# Patient Record
Sex: Female | Born: 1935 | Race: White | Hispanic: No | Smoking: Never smoker
Health system: Southern US, Community
[De-identification: ages and names within clinical notes are randomized; demographics above are authoritative.]

## PROBLEM LIST (undated history)

## (undated) DIAGNOSIS — I10 Essential (primary) hypertension: Secondary | ICD-10-CM

## (undated) DIAGNOSIS — G709 Myoneural disorder, unspecified: Secondary | ICD-10-CM

## (undated) DIAGNOSIS — M1990 Unspecified osteoarthritis, unspecified site: Secondary | ICD-10-CM

## (undated) HISTORY — PX: JOINT REPLACEMENT: SHX530

## (undated) HISTORY — PX: ANTERIOR AND POSTERIOR REPAIR: SHX1172

## (undated) HISTORY — PX: GANGLION CYST EXCISION: SHX1691

## (undated) HISTORY — PX: ABDOMINAL HYSTERECTOMY: SHX81

## (undated) HISTORY — PX: HAMMER TOE SURGERY: SHX385

## (undated) HISTORY — PX: GASTRIC BYPASS: SHX52

---

## 2005-05-20 ENCOUNTER — Ambulatory Visit: Payer: Self-pay | Admitting: *Deleted

## 2006-06-01 ENCOUNTER — Ambulatory Visit: Payer: Self-pay | Admitting: *Deleted

## 2007-05-25 ENCOUNTER — Ambulatory Visit: Payer: Self-pay | Admitting: Gastroenterology

## 2007-06-23 ENCOUNTER — Ambulatory Visit: Payer: Self-pay | Admitting: *Deleted

## 2007-08-24 ENCOUNTER — Ambulatory Visit: Payer: Self-pay | Admitting: Unknown Physician Specialty

## 2008-02-28 ENCOUNTER — Ambulatory Visit: Payer: Self-pay | Admitting: Unknown Physician Specialty

## 2008-07-31 ENCOUNTER — Ambulatory Visit: Payer: Self-pay | Admitting: Family Medicine

## 2008-09-11 ENCOUNTER — Ambulatory Visit: Payer: Self-pay | Admitting: Diagnostic Radiology

## 2009-08-22 ENCOUNTER — Ambulatory Visit: Payer: Self-pay | Admitting: Family Medicine

## 2009-10-28 ENCOUNTER — Ambulatory Visit: Payer: Self-pay | Admitting: Unknown Physician Specialty

## 2009-11-06 ENCOUNTER — Inpatient Hospital Stay: Payer: Self-pay | Admitting: Unknown Physician Specialty

## 2009-11-12 ENCOUNTER — Encounter: Payer: Self-pay | Admitting: Internal Medicine

## 2010-09-24 ENCOUNTER — Ambulatory Visit: Payer: Self-pay | Admitting: Family Medicine

## 2010-10-27 ENCOUNTER — Ambulatory Visit: Payer: Self-pay | Admitting: Family Medicine

## 2010-10-29 ENCOUNTER — Ambulatory Visit: Payer: Self-pay | Admitting: Family Medicine

## 2010-11-24 ENCOUNTER — Ambulatory Visit: Payer: Self-pay | Admitting: Surgery

## 2010-11-26 LAB — PATHOLOGY REPORT

## 2011-11-04 ENCOUNTER — Ambulatory Visit: Payer: Self-pay | Admitting: Family Medicine

## 2012-01-06 ENCOUNTER — Ambulatory Visit: Payer: Self-pay | Admitting: Ophthalmology

## 2012-01-06 LAB — POTASSIUM: Potassium: 4.1 mmol/L (ref 3.5–5.1)

## 2012-01-18 ENCOUNTER — Ambulatory Visit: Payer: Self-pay | Admitting: Ophthalmology

## 2012-02-08 ENCOUNTER — Ambulatory Visit: Payer: Self-pay | Admitting: Ophthalmology

## 2012-12-07 ENCOUNTER — Ambulatory Visit: Payer: Self-pay | Admitting: Family Medicine

## 2013-12-11 ENCOUNTER — Ambulatory Visit: Payer: Self-pay | Admitting: Family Medicine

## 2015-02-10 NOTE — Op Note (Signed)
PATIENT NAME:  Melinda Moses, Melinda Moses MR#:  [id number] DATE OF BIRTH:  08/17/1933  DATE OF PROCEDURE:  02/08/2012  PREOPERATIVE DIAGNOSIS: Cataract, left eye.   POSTOPERATIVE DIAGNOSIS: Cataract, left eye.   PROCEDURE PERFORMED: Extracapsular cataract extraction using phacoemulsification with placement of an Alcon toric [id number] 24.0-diopter posterior chamber lens with 1.5 diopters a cylinder, serial number [id number].   SURGEON: Maylon PeppersSteven A. Marg Macmaster, MD   ANESTHESIA: 4% lidocaine and 0.75% Marcaine in a 50-50 mixture with 10 units/mL of Hylenex added, given as a peribulbar.   ANESTHESIOLOGIST: Cena BentonG. F. Van Staveren, MD   COMPLICATIONS: None.   ESTIMATED BLOOD LOSS: Less than 1 mL.   DESCRIPTION OF PROCEDURE: The patient was brought to the Operating Room, and both eyes were anesthetized with topical proparacaine. With the patient sitting upright and fixing on a distant target, the 3:00 and 9:00 positions were marked using an [place] toric marker. The patient was then placed supine, given IV sedation and peribulbar block. She was then prepped and draped in the usual fashion. The vertical rectus muscles were imbricated using 5-0 silk sutures, a bridle suture, and limbal peritomy was carried out at 90 degrees. The toric marker was brought to the table and placed over the 3:00 and 9:00 marks, and the 46-degree position was then marked using a marking pen. A partial thickness scleral groove was made at the 90-degree mark and dissected anteriorly into clear cornea with an Alcon crescent knife. The anterior chamber was entered superotemporally through clear cornea with a paracentesis knife and through the lamellar dissection with a 2.6 mm keratome. DisCoVisc was used to replace the aqueous, and a continuous tear circular capsulorrhexis was carried out. Hydrodelineation was used to loosen the nucleus. Phacoemulsification was carried out in a divide and conquer technique. Ultrasound time was one minute and 8 seconds  with an average power of 16%, CDE of 19.40. Irrigation-aspiration was used to remove the residual cortex. The capsular bag was inflated with DisCoVisc, and the intraocular lens was inserted into the capsular bag using a Par[employer]arch shooter. The lens was rotated so the marks in the haptics lined up with the 46-degree marks. Irrigation-aspiration was used to remove the residual DisCoVisc. Care was taken to make sure that the lens did not rotate during removal of the DisCoVisc. Position of the lens was reconfirmed, and the wound was inflated with balanced salt. Miostat was injected through the paracentesis tract to induce miosis and deepen the chamber. The wound was checked for leaks and none were found. The conjunctiva was closed with cautery. The bridle suture was removed. Two drops of Vigamox were placed on the eye. The patient was discharged to the recovery room in good condition.   ____________________________ Maylon PeppersSteven A. Tay Whitwell, MD sad:cbb D: 02/08/2012 13:02:12 ET T: 02/08/2012 13:22:00 ET JOB#: [id number]  cc: Viviann SpareSteven A. Shi Grose, MD, <Dictator> Erline LevineSTEVEN A Ambrea Hegler MD ELECTRONICALLY SIGNED 02/15/2012 13:45

## 2015-02-10 NOTE — Op Note (Signed)
PATIENT NAME:  Melinda Moses, Melinda Moses MR#:  [id number] DATE OF BIRTH:  01-16-1933  DATE OF PROCEDURE:  01/18/2012  PREOPERATIVE DIAGNOSIS: Cataract, right eye.   POSTOPERATIVE DIAGNOSIS: Cataract, right eye.   PROCEDURE PERFORMED: Extracapsular cataract extraction using phacoemulsification with placement of an Alcon [id number] 25.5-diopter posterior chamber lens with 1.5 diopters of cylinder, serial number [id number].   SURGEON: Maylon PeppersSteven A. Shaunta Oncale, MD  ANESTHESIA: 4% lidocaine, 0.75% Marcaine 50-50 mixture with 10 units/mL of Hylenex added, given as a peribulbar.   ANESTHESIOLOGIST: Dr. Zena AmosWilliam Kephart  COMPLICATIONS: None.   ESTIMATED BLOOD LOSS: Less than 1 mL.   DESCRIPTION OF PROCEDURE: Patient was brought to the Operating Room and each eye was anesthetized with topical proparacaine. Sitting upright a degree marker was used to mark the 3:00 and 9:00 positions on the cornea. The patient was placed supine and given IV sedation and a peribulbar block. She was then prepped and draped in the usual fashion. The vertical rectus muscles were imbricated using 5-0 silk sutures, bridle sutures. A degree marker was brought to the table and centered over the 3:00 and 9:00 positions. 110 degrees axis was marked with a marking pen. Conjunctiva was taken down for one clock hour centered around the 110 degree mark and a partial thickness scleral groove was made at the posterior surgical limbus. This was dissected anteriorly into clear cornea with an Alcon crescent knife. The anterior chamber was entered superonasally through clear cornea with a paracentesis knife and through the lamellar dissection with a 2.6 mm keratome. DisCoVisc was used to replace the aqueous. A continuous tear circular capsulorrhexis was carried out. Hydrodissection was used to loosen the nucleus and phacoemulsification was carried out in a divide and conquer technique. Ultrasound time was 1 minute, 23 seconds, average power 18.3% with CDE 26.16.  Irrigation/aspiration was used to remove the residual cortex. Capsular bag was inflated with DisCoVisc and the intraocular lens was inserted into the capsular bag. Care was taken to rotate the lens to line up with the 110 degree marks using Sinskey hook. DisCoVisc was carefully removed using I/A hand piece. The position of lens was confirmed. The wound was inflated with balanced salt and Miostat was injected through the paracentesis track. The wound was checked for leaks, none were found. The conjunctiva was then closed with cautery. The bridle sutures were removed and two drops of Vigamox were placed on the eye. A shield was placed on the eye and patient was discharged to recovery room in good condition.   ____________________________ Maylon PeppersSteven A. Nayan Proch, MD sad:cms D: 01/18/2012 13:16:41 ET T: 01/18/2012 13:59:32 ET JOB#: [id number]  cc: Viviann SpareSteven A. Brittney Caraway, MD, <Dictator> Erline LevineSTEVEN A Tonga Prout MD ELECTRONICALLY SIGNED 01/19/2012 13:57

## 2015-03-14 ENCOUNTER — Other Ambulatory Visit: Payer: Self-pay | Admitting: Family Medicine

## 2015-03-14 DIAGNOSIS — Z0000 Encounter for general adult medical examination without abnormal findings: Secondary | ICD-10-CM

## 2015-03-21 ENCOUNTER — Ambulatory Visit
Admission: RE | Admit: 2015-03-21 | Discharge: 2015-03-21 | Disposition: A | Discharge status: Home / Self Care | Payer: Medicare Other | Source: Ambulatory Visit | Attending: Family Medicine | Admitting: Family Medicine

## 2015-03-21 DIAGNOSIS — Z0000 Encounter for general adult medical examination without abnormal findings: Secondary | ICD-10-CM | POA: Diagnosis not present

## 2015-03-21 DIAGNOSIS — M8580 Other specified disorders of bone density and structure, unspecified site: Secondary | ICD-10-CM | POA: Insufficient documentation

## 2016-10-23 ENCOUNTER — Other Ambulatory Visit: Payer: Self-pay | Admitting: Student

## 2016-10-23 DIAGNOSIS — M5441 Lumbago with sciatica, right side: Principal | ICD-10-CM

## 2016-10-23 DIAGNOSIS — G8929 Other chronic pain: Secondary | ICD-10-CM

## 2016-10-29 ENCOUNTER — Ambulatory Visit
Admission: RE | Admit: 2016-10-29 | Discharge: 2016-10-29 | Disposition: A | Discharge status: Home / Self Care | Payer: Medicare Other | Source: Ambulatory Visit | Attending: Student | Admitting: Student

## 2016-10-29 DIAGNOSIS — M48061 Spinal stenosis, lumbar region without neurogenic claudication: Secondary | ICD-10-CM | POA: Diagnosis not present

## 2016-10-29 DIAGNOSIS — G8929 Other chronic pain: Secondary | ICD-10-CM | POA: Diagnosis not present

## 2016-10-29 DIAGNOSIS — M419 Scoliosis, unspecified: Secondary | ICD-10-CM | POA: Insufficient documentation

## 2016-10-29 DIAGNOSIS — M5126 Other intervertebral disc displacement, lumbar region: Secondary | ICD-10-CM | POA: Insufficient documentation

## 2016-10-29 DIAGNOSIS — M5441 Lumbago with sciatica, right side: Secondary | ICD-10-CM | POA: Insufficient documentation

## 2017-01-15 ENCOUNTER — Ambulatory Visit
Admission: RE | Admit: 2017-01-15 | Discharge: 2017-01-15 | Disposition: A | Discharge status: Home / Self Care | Payer: Medicare Other | Source: Ambulatory Visit | Attending: Family Medicine | Admitting: Family Medicine

## 2017-01-15 ENCOUNTER — Other Ambulatory Visit: Payer: Self-pay | Admitting: Family Medicine

## 2017-01-15 DIAGNOSIS — M5441 Lumbago with sciatica, right side: Secondary | ICD-10-CM | POA: Insufficient documentation

## 2017-01-15 DIAGNOSIS — G8929 Other chronic pain: Secondary | ICD-10-CM | POA: Insufficient documentation

## 2017-01-15 DIAGNOSIS — R911 Solitary pulmonary nodule: Secondary | ICD-10-CM

## 2017-02-04 ENCOUNTER — Other Ambulatory Visit: Payer: Self-pay | Admitting: Neurological Surgery

## 2017-02-04 DIAGNOSIS — M5416 Radiculopathy, lumbar region: Secondary | ICD-10-CM

## 2017-02-04 DIAGNOSIS — M47816 Spondylosis without myelopathy or radiculopathy, lumbar region: Secondary | ICD-10-CM

## 2017-02-19 ENCOUNTER — Ambulatory Visit
Admission: RE | Admit: 2017-02-19 | Discharge: 2017-02-19 | Disposition: A | Discharge status: Home / Self Care | Payer: Medicare Other | Source: Ambulatory Visit | Attending: Neurological Surgery | Admitting: Neurological Surgery

## 2017-02-19 ENCOUNTER — Encounter: Payer: Self-pay | Admitting: *Deleted

## 2017-02-19 DIAGNOSIS — M5416 Radiculopathy, lumbar region: Secondary | ICD-10-CM

## 2017-02-19 DIAGNOSIS — M48061 Spinal stenosis, lumbar region without neurogenic claudication: Secondary | ICD-10-CM | POA: Insufficient documentation

## 2017-02-19 DIAGNOSIS — M47816 Spondylosis without myelopathy or radiculopathy, lumbar region: Secondary | ICD-10-CM | POA: Diagnosis not present

## 2017-02-19 HISTORY — DX: Essential (primary) hypertension: I10

## 2017-02-19 HISTORY — DX: Myoneural disorder, unspecified: G70.9

## 2017-02-19 HISTORY — DX: Unspecified osteoarthritis, unspecified site: M19.90

## 2017-02-19 MED ORDER — IOPAMIDOL (ISOVUE-M 200) INJECTION 41%
10.0000 mL | Freq: Once | INTRAMUSCULAR | Status: AC
  Administered 2017-02-19: 10 mL via EPIDURAL

## 2017-02-19 MED ORDER — ACETAMINOPHEN 500 MG PO TABS
1000.0000 mg | ORAL_TABLET | Freq: Four times a day (QID) | ORAL | Status: DC | PRN
  Administered 2017-02-19: 1000 mg via ORAL
  Filled 2017-02-19 (×2): qty 2

## 2017-02-19 NOTE — Progress Notes (Signed)
Patient ID: Melinda Moses, female   DOB: 02/24/1933, 83 y.o.   MRN: [id number] Pt. Stable after myelogram.VSS.Back stable.D/C instructions given.F/U with Melinda Moses M.D.

## 2017-02-19 NOTE — Discharge Instructions (Signed)
Myelogram, Care After °Refer to this sheet in the next few weeks. These instructions provide you with information about caring for yourself after your procedure. Your health care provider may also give you more specific instructions. Your treatment has been planned according to current medical practices, but problems sometimes occur. Call your health care provider if you have any problems or questions after your procedure. °What can I expect after the procedure? °After the procedure, it is common to have: °· Soreness at your injection site. °· A mild headache. °Follow these instructions at home: °· Drink enough fluid to keep your urine clear or pale yellow. This will help flush out the dye (contrast material) from your spine. °· Rest as told by your health care provider. Lie flat with your head slightly raised (elevated) to reduce the risk of headache. °· Do not bend, lift, or do any strenuous activity for 24-48 hours or as told by your health care provider. °· Take over-the-counter and prescription medicines only as told by your health care provider. °· Take care of and remove your bandage (dressing) as told by your health care provider. °· Bathe or shower as told by your health care provider. °Contact a health care provider if: °· You have a fever. °· You have a headache that lasts longer than 24 hours. °· You feel nauseous or vomit. °· You have a stiff neck or numbness in your legs. °· You are unable to urinate or have a bowel movement. °· You develop a rash, itching, or sneezing. °Get help right away if: °· You have new symptoms or your symptoms get worse. °· You have a seizure. °· You have trouble breathing. °This information is not intended to replace advice given to you by your health care provider. Make sure you discuss any questions you have with your health care provider. °Document Released: 11/01/2015 Document Revised: 03/12/2016 Document Reviewed: 07/18/2015 °Elsevier Interactive Patient Education © 2017  [employer] ° °

## 2017-12-30 IMAGING — CT CT L SPINE W/O CM
1 of 7 series · 6 of 14 positions shown, 8 images · non-contrast
Comparison: None.

CLINICAL DATA: Chronic low back pain.  Radiating right leg pain.

EXAM:
CT LUMBAR SPINE WITHOUT CONTRAST
TECHNIQUE: Multidetector CT imaging of the lumbar spine was performed without
intravenous contrast administration. Multiplanar CT image
reconstructions were also generated.

[Series 4: l spine soft · axial · 0.34mm/px · z∈[-876,-688]mm · 6 of 132 slices shown, 8 images]
[im 19/132  soft-tissue]
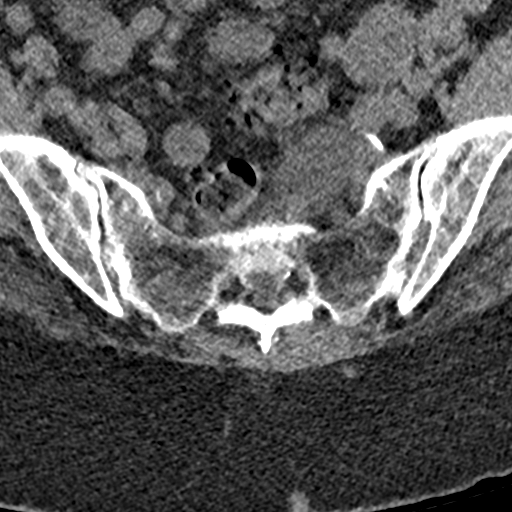
[im 19/132  bone]
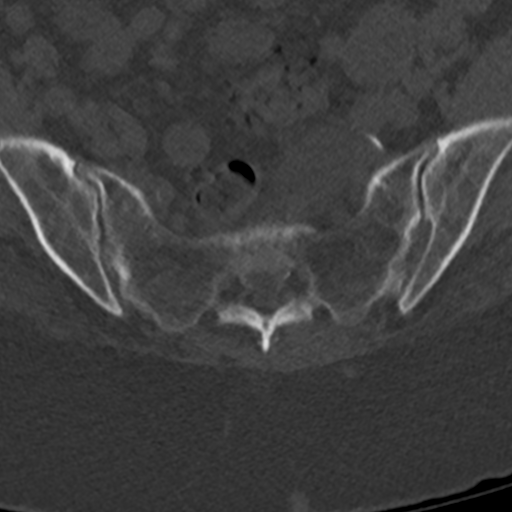
[im 38/132  bone]
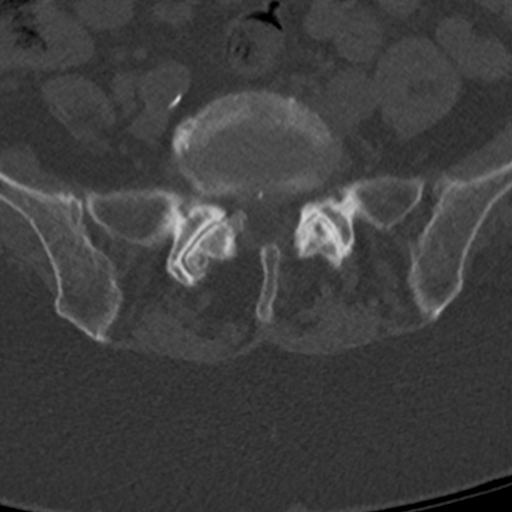
[im 57/132  bone]
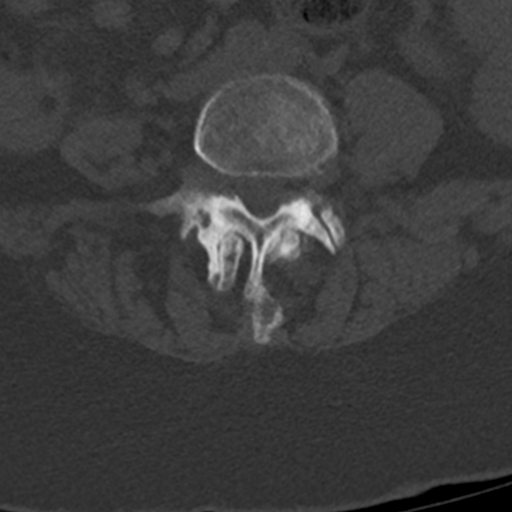
[im 75/132  bone]
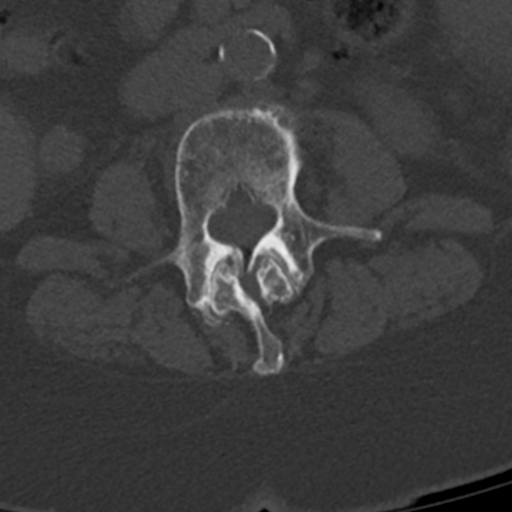
[im 94/132  soft-tissue]
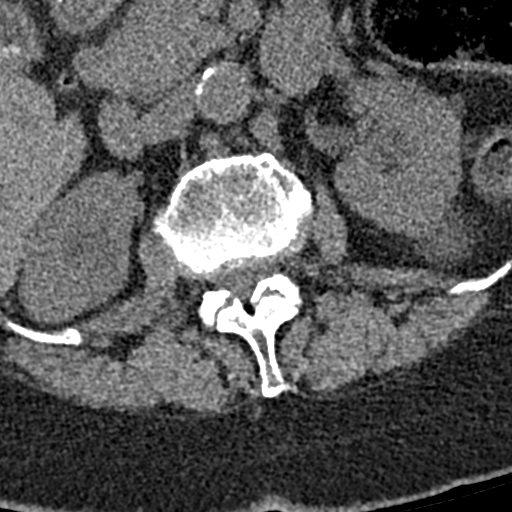
[im 94/132  bone]
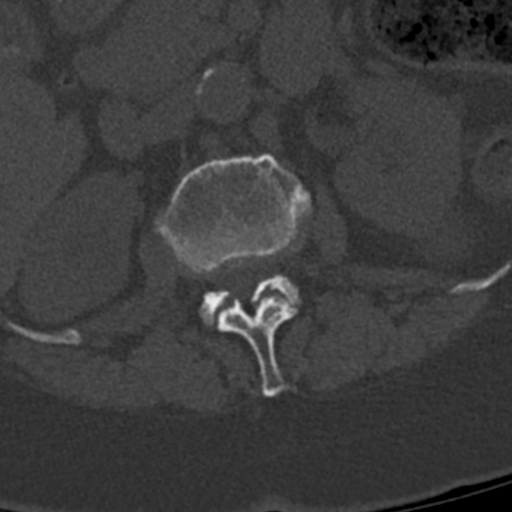
[im 113/132  bone]
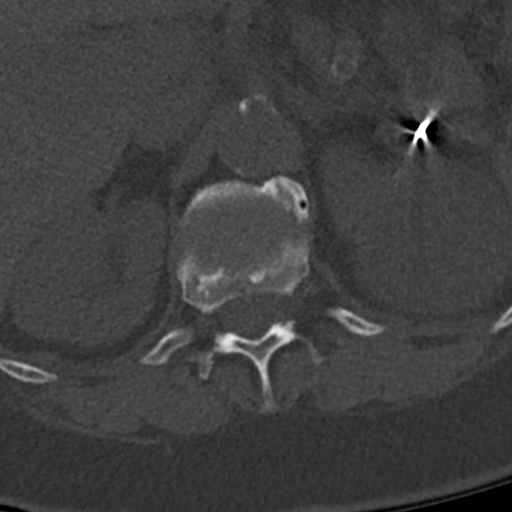

[6 of 14 positions shown; findings below may reference images not displayed]

FINDINGS: Segmentation: 5 lumbar type vertebral bodies. The last full
intervertebral disc space is labeled L5-S1.

Alignment: The overall alignment is maintained in the sagittal
plane. There is moderate scoliosis and degenerative lumbar
spondylosis. Mild degenerative anterolisthesis of L4.

Vertebrae: No fracture or bone lesion.

Paraspinal and other soft tissues: No significant findings.
Gallstones are noted in the gallbladder. There is a large left renal
cyst. Moderate atherosclerotic calcifications involving the aorta.
Surgical changes likely from gastric bypass surgery.

Disc levels: T11-12: Advanced degenerative disc disease and facet
disease but no significant spinal or foraminal stenosis.

T12-L1: Disc disease and facet disease but no spinal or foraminal
stenosis.

L1-2: Diffuse bulging degenerated annulus, osteophytic ridging and
facet disease. Mild bilateral lateral recess stenosis but no
significant spinal or foraminal stenosis.

L2-3: Bulging annulus, osteophytic ridging, short pedicles and facet
disease contributing to mild spinal and moderate bilateral lateral
recess stenosis. No significant foraminal stenosis.

L3-4: Bulging uncovered disc, short pedicles, advanced facet disease
and ligamentum flavum thickening contributing to moderate spinal and
bilateral lateral recess stenosis. Mild foraminal narrowing
bilaterally, right greater than left.

L4-5: Diffuse bulging annulus, osteophytic spurring, short pedicles,
advanced facet disease and thickening and buckling of the ligamentum
flavum contributing to moderately severe to severe spinal and
bilateral lateral recess stenosis. There is also a broad-based right
foraminal disc protrusion likely effecting the right L4 nerve root
and may be responsible for the patient's right radicular symptoms.

L5-S1: Small calcification associate with the posterior aspect of
the disc likely calcifications associate with an annular tear. No
significant disc protrusion, spinal or foraminal stenosis. Severe
facet disease.
IMPRESSION: 1. Scoliosis and advanced degenerative lumbar spondylosis with
multilevel disc disease and facet disease.
2. Moderate multilevel multifactorial spinal and lateral recess
stenosis at L2-3 and L3-4.
3. Moderately severe to severe multifactorial spinal and bilateral
lateral recess stenosis at L4-5. There is also a right foraminal
disc protrusion at this level likely effecting the right L4 nerve
root and possibly responsible for the patient's right radicular
symptoms.

## 2018-04-22 IMAGING — RF DG MYELOGRAM LUMBAR
2 series · 6 of 6 positions shown · non-contrast
Comparison: CT 10/29/2016.

CLINICAL DATA: Lumbar spondylosis.  Radiculopathy.
TECHNIQUE: Contiguous axial images were obtained through the Lumbar spine after
the intrathecal infusion of infusion. Coronal and sagittal
reconstructions were obtained of the axial image sets.

[Series 1: fluoro_myelogram_singleshot_bw · 0.17mm/px · 5 of 5 slices shown]
[im 1/5]
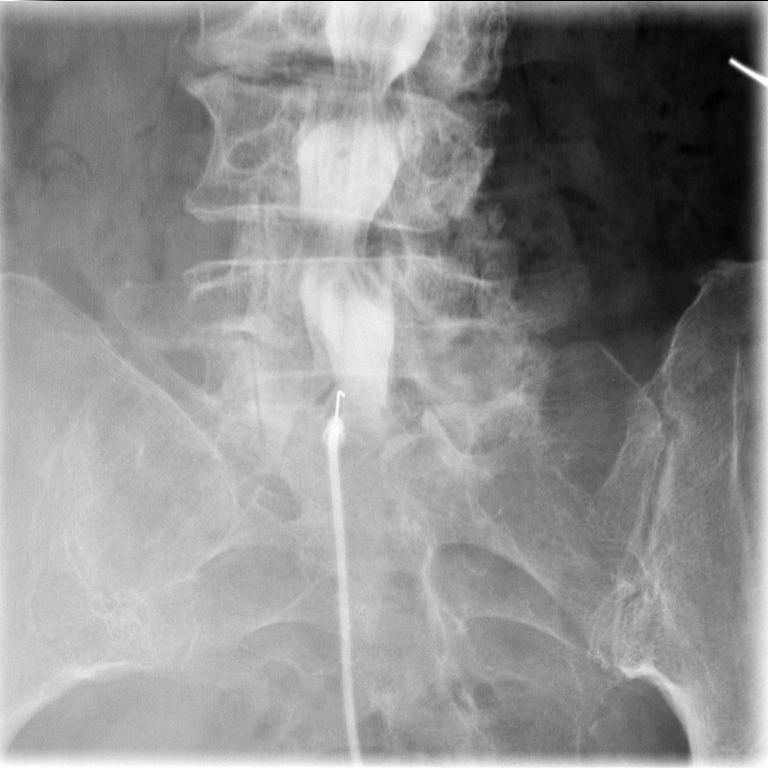
[im 2/5]
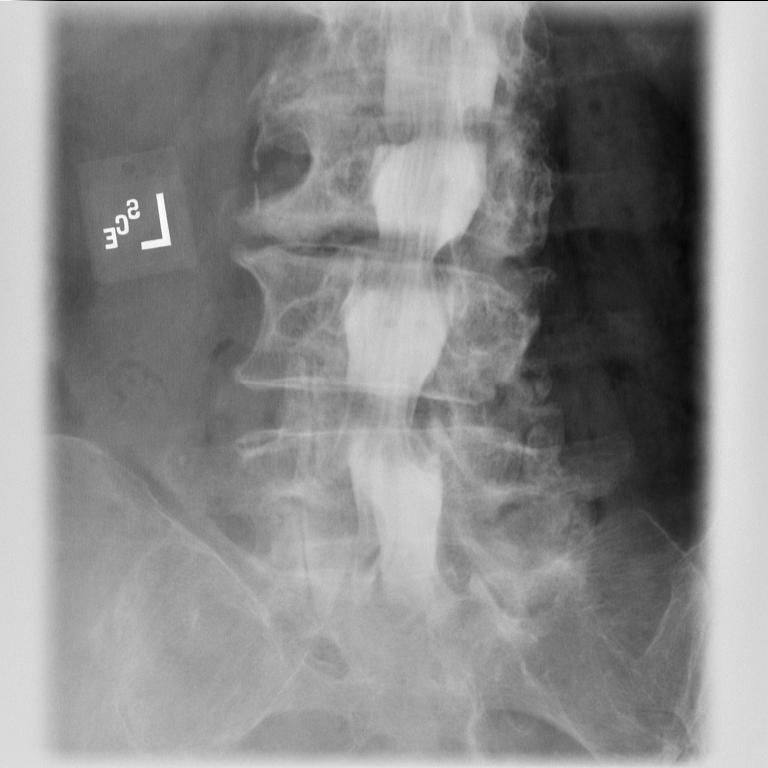
[im 3/5]
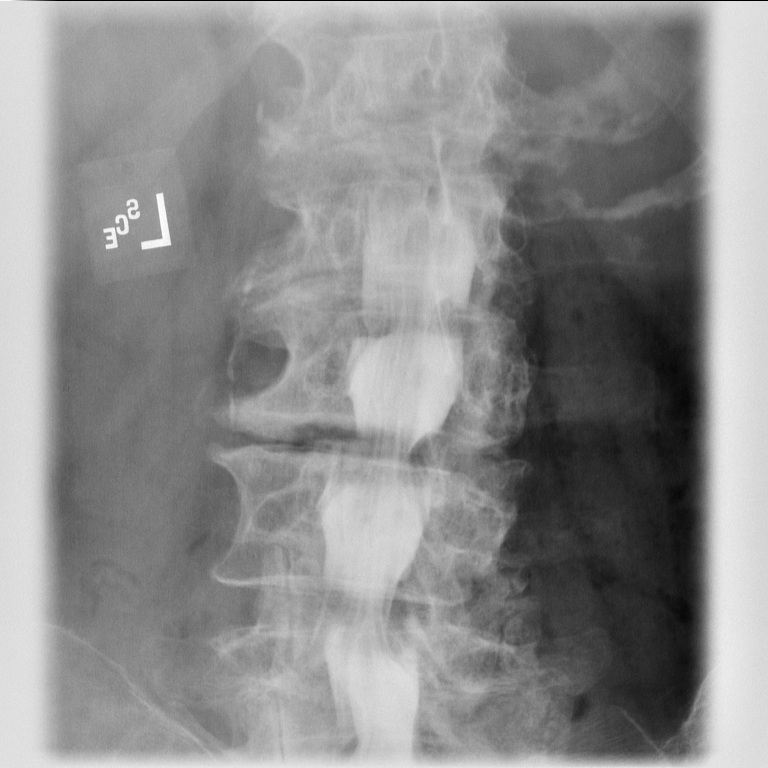
[im 4/5]
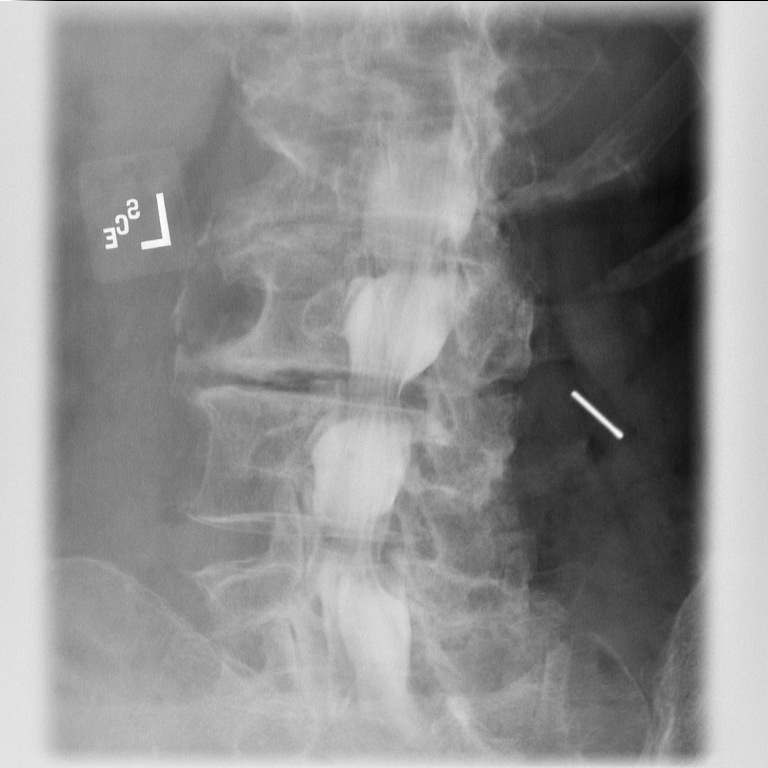
[im 5/5]
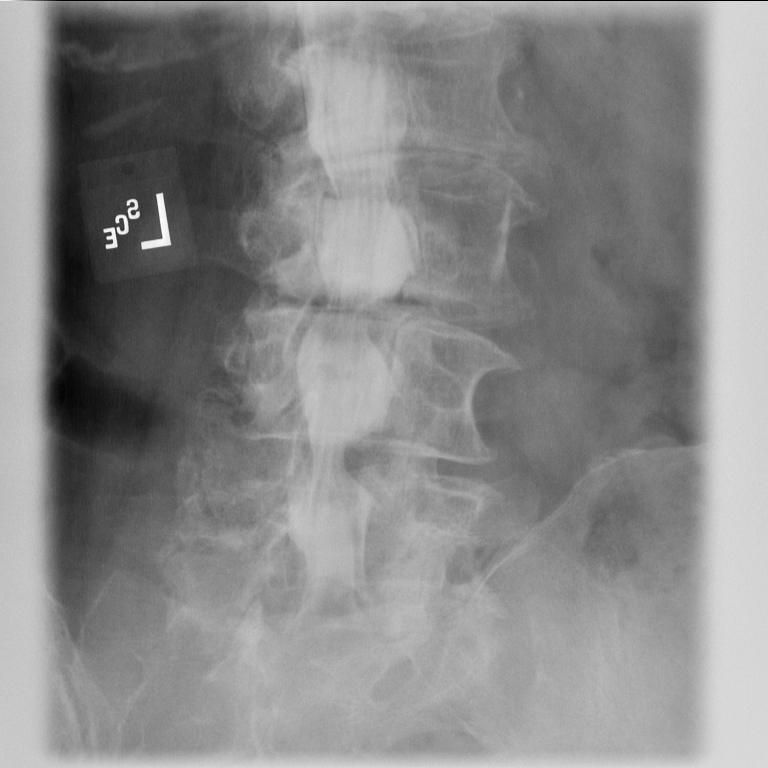

[Series 6: x lumbar spine lat · 0.14mm/px · 1 of 1 slices shown]
[im 1/1]
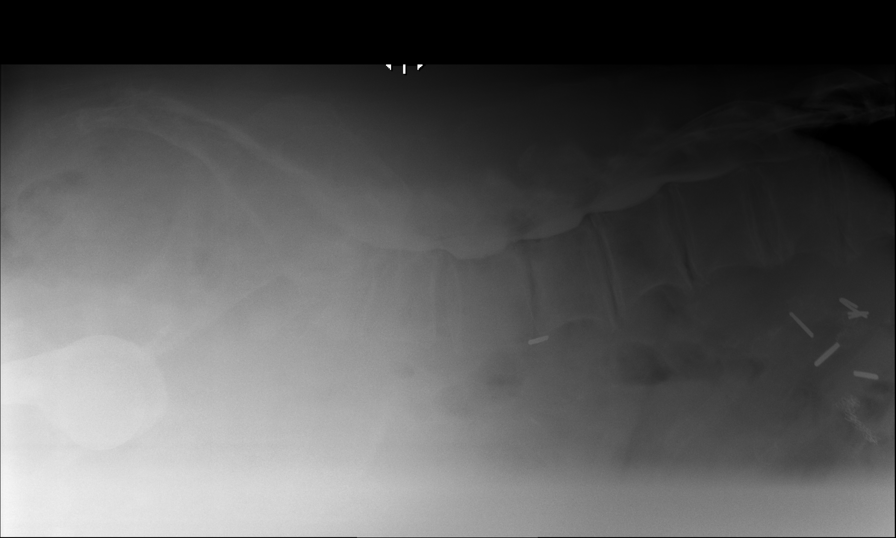

[6 of 6 positions shown; findings below may reference images not displayed]

EXAM:
LUMBAR MYELOGRAM

FLUOROSCOPY TIME:  2 minutes 0 seconds. Radiation dose 120.4 mGy.
Five fluoroscopic spot images obtained.

PROCEDURE:
After thorough discussion of risks and benefits of the procedure
including bleeding, infection, injury to nerves, blood vessels,
adjacent structures as well as headache, written and oral informed
consent was obtained. Consent was obtained by Dr. Japarlin Ratu Murniati.
Time out form was completed.

Patient was positioned prone on the fluoroscopy table. Local
anesthesia was provided with 1% lidocaine without epinephrine after
prepped and draped in the usual sterile fashion. Puncture was
performed at L5-S1 using a 5 inch 22-gauge spinal needle via right
_Paramedian) approach. Using a single pass through the dura, the
needle was placed within the thecal sac, with return of clear CSF.
12 mL of Isovue M 200 was injected into the thecal sac, with normal
opacification of the nerve roots and cauda equina consistent with
free flow within the subarachnoid space.

I personally performed the lumbar puncture and administered the
intrathecal contrast. I also personally supervised acquisition of
the myelogram images.
FINDINGS: LUMBAR MYELOGRAM FINDINGS:

Lumbar myelogram was performed successfully without complication.
Multilevel severe spinal stenosis is present . Reference is made to
post myelogram CT for full discussion of findings.

CT LUMBAR MYELOGRAM FINDINGS:

Report to follow-up.
IMPRESSION: LUMBAR MYELOGRAM IMPRESSION:

Successful lumbar myelogram. Multilevel severe spinal stenosis is
present. Reference is made to postmyelogram CT for further
discussion .

## 2024-02-16 ENCOUNTER — Other Ambulatory Visit: Payer: Self-pay

## 2024-02-16 ENCOUNTER — Emergency Department
Admission: EM | Admit: 2024-02-16 | Discharge: 2024-02-16 | Discharge status: Against Medical Advice | Attending: Emergency Medicine | Admitting: Emergency Medicine

## 2024-02-16 DIAGNOSIS — R509 Fever, unspecified: Secondary | ICD-10-CM | POA: Diagnosis not present

## 2024-02-16 DIAGNOSIS — W06XXXA Fall from bed, initial encounter: Secondary | ICD-10-CM | POA: Insufficient documentation

## 2024-02-16 DIAGNOSIS — M7918 Myalgia, other site: Secondary | ICD-10-CM | POA: Diagnosis not present

## 2024-02-16 DIAGNOSIS — Z5321 Procedure and treatment not carried out due to patient leaving prior to being seen by health care provider: Secondary | ICD-10-CM | POA: Insufficient documentation

## 2024-02-16 DIAGNOSIS — Y92003 Bedroom of unspecified non-institutional (private) residence as the place of occurrence of the external cause: Secondary | ICD-10-CM | POA: Diagnosis not present

## 2024-02-16 DIAGNOSIS — R531 Weakness: Secondary | ICD-10-CM | POA: Diagnosis present

## 2024-02-16 LAB — CBC
HCT: 33.2 % — ABNORMAL LOW (ref 36.0–46.0)
Hemoglobin: 10.9 g/dL — ABNORMAL LOW (ref 12.0–15.0)
MCH: 32.3 pg (ref 26.0–34.0)
MCHC: 32.8 g/dL (ref 30.0–36.0)
MCV: 98.5 fL (ref 80.0–100.0)
Platelets: 156 10*3/uL (ref 150–400)
RBC: 3.37 MIL/uL — ABNORMAL LOW (ref 3.87–5.11)
RDW: 12.9 % (ref 11.5–15.5)
WBC: 9.2 10*3/uL (ref 4.0–10.5)
nRBC: 0 % (ref 0.0–0.2)

## 2024-02-16 LAB — URINALYSIS, ROUTINE W REFLEX MICROSCOPIC
Bilirubin Urine: NEGATIVE
Glucose, UA: NEGATIVE mg/dL
Hgb urine dipstick: NEGATIVE
Ketones, ur: NEGATIVE mg/dL
Nitrite: POSITIVE — AB
Protein, ur: NEGATIVE mg/dL
Specific Gravity, Urine: 1.02 (ref 1.005–1.030)
pH: 5 (ref 5.0–8.0)

## 2024-02-16 LAB — BASIC METABOLIC PANEL WITH GFR
Anion gap: 9 (ref 5–15)
BUN: 31 mg/dL — ABNORMAL HIGH (ref 8–23)
CO2: 24 mmol/L (ref 22–32)
Calcium: 8.4 mg/dL — ABNORMAL LOW (ref 8.9–10.3)
Chloride: 103 mmol/L (ref 98–111)
Creatinine, Ser: 1.62 mg/dL — ABNORMAL HIGH (ref 0.44–1.00)
GFR, Estimated: 30 mL/min — ABNORMAL LOW (ref >60)
Glucose, Bld: 138 mg/dL — ABNORMAL HIGH (ref 70–99)
Potassium: 4.4 mmol/L (ref 3.5–5.1)
Sodium: 136 mmol/L (ref 135–145)

## 2024-02-16 NOTE — ED Notes (Signed)
 Pt took tylenol  at 1500 today. No tylenol  given at this time.

## 2024-02-16 NOTE — ED Triage Notes (Signed)
 First nurse note: Pt to ED via [facility] from home. Pt reports fever, chills body aches since yesterday. Pt reports tried to get out of bed to use bathroom today and fell from weakness.   20g RAC 500cc 101.4 oral  155/87 HR 80s afib

## 2024-02-16 NOTE — ED Notes (Addendum)
 20GA SL removed from left a/c at pt request; cannula intact & dressing applied; assisted to BR --stand by assist required only

## 2024-02-17 ENCOUNTER — Emergency Department (HOSPITAL_COMMUNITY)
Admission: EM | Admit: 2024-02-17 | Discharge: 2024-02-17 | Disposition: A | Discharge status: Home / Self Care | Attending: Emergency Medicine | Admitting: Emergency Medicine

## 2024-02-17 ENCOUNTER — Telehealth: Payer: Self-pay | Admitting: Emergency Medicine

## 2024-02-17 ENCOUNTER — Encounter (HOSPITAL_COMMUNITY): Payer: Self-pay

## 2024-02-17 ENCOUNTER — Encounter: Payer: Self-pay | Admitting: Family Medicine

## 2024-02-17 ENCOUNTER — Ambulatory Visit: Payer: Self-pay

## 2024-02-17 ENCOUNTER — Emergency Department (HOSPITAL_COMMUNITY)

## 2024-02-17 DIAGNOSIS — I10 Essential (primary) hypertension: Secondary | ICD-10-CM | POA: Diagnosis not present

## 2024-02-17 DIAGNOSIS — N3000 Acute cystitis without hematuria: Secondary | ICD-10-CM | POA: Diagnosis not present

## 2024-02-17 DIAGNOSIS — J029 Acute pharyngitis, unspecified: Secondary | ICD-10-CM | POA: Diagnosis present

## 2024-02-17 DIAGNOSIS — U071 COVID-19: Secondary | ICD-10-CM | POA: Insufficient documentation

## 2024-02-17 LAB — CBC WITH DIFFERENTIAL/PLATELET
Abs Immature Granulocytes: 0.01 10*3/uL (ref 0.00–0.07)
Basophils Absolute: 0 10*3/uL (ref 0.0–0.1)
Basophils Relative: 0 %
Eosinophils Absolute: 0 10*3/uL (ref 0.0–0.5)
Eosinophils Relative: 0 %
HCT: 31.9 % — ABNORMAL LOW (ref 36.0–46.0)
Hemoglobin: 10.1 g/dL — ABNORMAL LOW (ref 12.0–15.0)
Immature Granulocytes: 0 %
Lymphocytes Relative: 18 %
Lymphs Abs: 1.4 10*3/uL (ref 0.7–4.0)
MCH: 31.7 pg (ref 26.0–34.0)
MCHC: 31.7 g/dL (ref 30.0–36.0)
MCV: 100 fL (ref 80.0–100.0)
Monocytes Absolute: 0.9 10*3/uL (ref 0.1–1.0)
Monocytes Relative: 11 %
Neutro Abs: 5.5 10*3/uL (ref 1.7–7.7)
Neutrophils Relative %: 71 %
Platelets: 144 10*3/uL — ABNORMAL LOW (ref 150–400)
RBC: 3.19 MIL/uL — ABNORMAL LOW (ref 3.87–5.11)
RDW: 12.9 % (ref 11.5–15.5)
WBC: 7.8 10*3/uL (ref 4.0–10.5)
nRBC: 0 % (ref 0.0–0.2)

## 2024-02-17 LAB — COMPREHENSIVE METABOLIC PANEL WITH GFR
ALT: 16 U/L (ref 0–44)
AST: 29 U/L (ref 15–41)
Albumin: 3.2 g/dL — ABNORMAL LOW (ref 3.5–5.0)
Alkaline Phosphatase: 62 U/L (ref 38–126)
Anion gap: 11 (ref 5–15)
BUN: 32 mg/dL — ABNORMAL HIGH (ref 8–23)
CO2: 23 mmol/L (ref 22–32)
Calcium: 8.3 mg/dL — ABNORMAL LOW (ref 8.9–10.3)
Chloride: 98 mmol/L (ref 98–111)
Creatinine, Ser: 1.65 mg/dL — ABNORMAL HIGH (ref 0.44–1.00)
GFR, Estimated: 30 mL/min — ABNORMAL LOW (ref >60)
Glucose, Bld: 96 mg/dL (ref 70–99)
Potassium: 4.1 mmol/L (ref 3.5–5.1)
Sodium: 132 mmol/L — ABNORMAL LOW (ref 135–145)
Total Bilirubin: 0.9 mg/dL (ref 0.0–1.2)
Total Protein: 6.2 g/dL — ABNORMAL LOW (ref 6.5–8.1)

## 2024-02-17 LAB — RESP PANEL BY RT-PCR (RSV, FLU A&B, COVID)  RVPGX2
Influenza A by PCR: NEGATIVE
Influenza B by PCR: NEGATIVE
Resp Syncytial Virus by PCR: NEGATIVE
SARS Coronavirus 2 by RT PCR: POSITIVE — AB

## 2024-02-17 LAB — URINALYSIS, ROUTINE W REFLEX MICROSCOPIC
Bilirubin Urine: NEGATIVE
Glucose, UA: NEGATIVE mg/dL
Hgb urine dipstick: NEGATIVE
Ketones, ur: NEGATIVE mg/dL
Nitrite: POSITIVE — AB
Protein, ur: NEGATIVE mg/dL
Specific Gravity, Urine: 1.011 (ref 1.005–1.030)
pH: 5 (ref 5.0–8.0)

## 2024-02-17 MED ORDER — CEPHALEXIN 500 MG PO CAPS: 500.0000 mg | ORAL_CAPSULE | Freq: Two times a day (BID) | ORAL | 0 refills | Status: AC

## 2024-02-17 MED ORDER — SODIUM CHLORIDE 0.9 % IV SOLN
1.0000 g | Freq: Once | INTRAVENOUS | Status: AC
  Administered 2024-02-17: 1 g via INTRAVENOUS
  Filled 2024-02-17: qty 10

## 2024-02-17 MED ORDER — CEFTRIAXONE SODIUM 1 G IJ SOLR: 1.0000 g | Freq: Once | INTRAMUSCULAR | Status: DC

## 2024-02-17 MED ORDER — SODIUM CHLORIDE 0.9 % IV BOLUS
1000.0000 mL | Freq: Once | INTRAVENOUS | Status: AC
  Administered 2024-02-17: 1000 mL via INTRAVENOUS

## 2024-02-17 MED ORDER — ACETAMINOPHEN 325 MG PO TABS
650.0000 mg | ORAL_TABLET | Freq: Once | ORAL | Status: AC
Start: 2024-02-17 — End: 2024-02-17
  Administered 2024-02-17: 650 mg via ORAL
  Filled 2024-02-17: qty 2

## 2024-02-17 NOTE — Telephone Encounter (Signed)
 Called patient due to left emergency department before provider exam to inquire about condition and follow up plans. Called patient number and left message.  Called contact number and no answer.

## 2024-02-17 NOTE — ED Provider Notes (Signed)
 [place] [facility] AT [facility] Provider Note   CSN: [id number] Arrival date & time: 02/17/24  1312     History  Chief Complaint  Patient presents with   Weakness   Sore Throat    Melinda Moses is a 89 y.o. female with PMHx OA, HTN, who presents to ED concerned for rhinorrhea, chest congestion, sore throat, body aches, generalized weakness x2-3 days. Patient came to ED yesterday but left d/t wait time. Patient stating that she collapsed to the ground yesterday d/t the weakness. Had a 100.33F yesterday. Patient did not take any medications for her symptoms today. Does endorse a single episode of diarrhea yesterday which has since resolved. Patient stating that she started drinking more water and Gatorade yesterday and started feeling a little better.   Denies chest pain, SOB, nausea, vomiting, dysuria, hematuria, hematochezia.   Weakness Sore Throat       Home Medications Prior to Admission medications   Medication Sig Start Date End Date Taking? Authorizing Provider  cephALEXin  (KEFLEX ) 500 MG capsule Take 1 capsule (500 mg total) by mouth 2 (two) times daily. 02/17/24  Yes Dorisann Garre F, PA-C      Allergies    Tetracyclines & related, Inapsine [droperidol], and Penicillins    Review of Systems   Review of Systems  Neurological:  Positive for weakness.    Physical Exam Updated Vital Signs BP (!) 136/49 (BP Location: Left Arm)   Pulse (!) 56   Temp 98.7 F (37.1 C) (Oral)   Resp 20   Ht 5\' 3"  (1.6 m)   Wt 104.3 kg   SpO2 96%   BMI 40.74 kg/m  Physical Exam Vitals and nursing note reviewed.  Constitutional:      General: She is not in acute distress.    Appearance: She is not ill-appearing or toxic-appearing.  HENT:     Head: Normocephalic and atraumatic.     Mouth/Throat:     Mouth: Mucous membranes are moist.     Pharynx: No oropharyngeal exudate or posterior oropharyngeal erythema.  Eyes:     General: No scleral icterus.        Right eye: No discharge.        Left eye: No discharge.     Conjunctiva/sclera: Conjunctivae normal.  Cardiovascular:     Rate and Rhythm: Normal rate and regular rhythm.     Pulses: Normal pulses.     Heart sounds: Normal heart sounds. No murmur heard. Pulmonary:     Effort: Pulmonary effort is normal. No respiratory distress.     Breath sounds: Normal breath sounds. No wheezing, rhonchi or rales.  Abdominal:     General: Bowel sounds are normal. There is no distension.     Palpations: Abdomen is soft. There is no mass.     Tenderness: There is no abdominal tenderness.  Musculoskeletal:     Right lower leg: No edema.     Left lower leg: No edema.  Skin:    General: Skin is warm and dry.     Findings: No rash.  Neurological:     General: No focal deficit present.     Mental Status: She is alert and oriented to person, place, and time. Mental status is at baseline.     Comments: GCS 15. Speech is goal oriented. No deficits appreciated to CN III-XII; symmetric eyebrow raise, no facial drooping, tongue midline. Patient has equal grip strength bilaterally with 5/5 strength against resistance in all major  muscle groups bilaterally. Sensation to light touch intact. Patient moves extremities without ataxia.    Psychiatric:        Mood and Affect: Mood normal.        Behavior: Behavior normal.     ED Results / Procedures / Treatments   Labs (all labs ordered are listed, but only abnormal results are displayed) Labs Reviewed  RESP PANEL BY RT-PCR (RSV, FLU A&B, COVID)  RVPGX2 - Abnormal; Notable for the following components:      Result Value   SARS Coronavirus 2 by RT PCR POSITIVE (*)    All other components within normal limits  CBC WITH DIFFERENTIAL/PLATELET - Abnormal; Notable for the following components:   RBC 3.19 (*)    Hemoglobin 10.1 (*)    HCT 31.9 (*)    Platelets 144 (*)    All other components within normal limits  COMPREHENSIVE METABOLIC PANEL WITH GFR -  Abnormal; Notable for the following components:   Sodium 132 (*)    BUN 32 (*)    Creatinine, Ser 1.65 (*)    Calcium 8.3 (*)    Total Protein 6.2 (*)    Albumin 3.2 (*)    GFR, Estimated 30 (*)    All other components within normal limits  URINALYSIS, ROUTINE W REFLEX MICROSCOPIC - Abnormal; Notable for the following components:   APPearance HAZY (*)    Nitrite POSITIVE (*)    Leukocytes,Ua TRACE (*)    Bacteria, UA MANY (*)    All other components within normal limits  URINE CULTURE    EKG None  Radiology DG Chest 2 View Result Date: 02/17/2024 CLINICAL DATA:  Cough, fever, and weakness. EXAM: CHEST - 2 VIEW COMPARISON:  Chest two views 01/15/2017 FINDINGS: Cardiac silhouette and mediastinal contours are within limits. Mild-to-moderate atherosclerotic calcifications within the aortic arch. Mildly decreased lung volumes. Density overlying the inferior left lung and left heart is favored to be secondary to overlying left breast tissue on frontal view, as no longer space opacity is seen in this region on lateral view. No acute airspace opacity is seen. No pleural effusion pneumothorax. Moderate multilevel degenerative disc changes of the thoracic spine. IMPRESSION: No active cardiopulmonary disease. Electronically Signed   By: Bertina Broccoli M.D.   On: 02/17/2024 16:53    Procedures Procedures    Medications Ordered in ED Medications  sodium chloride  0.9 % bolus 1,000 mL (1,000 mLs Intravenous New Bag/Given 02/17/24 1609)  acetaminophen  (TYLENOL ) tablet 650 mg (650 mg Oral Given 02/17/24 1610)  cefTRIAXone  (ROCEPHIN ) 1 g in sodium chloride  0.9 % 100 mL IVPB (1 g Intravenous New Bag/Given 02/17/24 1829)    ED Course/ Medical Decision Making/ A&P                                 Medical Decision Making Amount and/or Complexity of Data Reviewed Radiology: ordered.  Risk OTC drugs. Prescription drug management.   This patient presents to the ED for concern of weakness, this  involves an extensive number of treatment options, and is a complaint that carries with it a high risk of complications and morbidity.  The differential diagnosis includes Ischemic stroke, intracerebral hemorrhage, subarachnoid hemorrhage, Guillain-Barr syndrome, hypoglycemia, electrolyte abnormality, sepsis, ACS, carbon monoxide poisoning, anemia, dehydration.   Co morbidities that complicate the patient evaluation  OA, HTN   Additional history obtained:  PCP with [facility]   Problem List /  ED Course / Critical interventions / Medication management  Patient presents to ED concern for URI symptoms and generalized weakness.  Physical exam reassuring.  Patient afebrile with stable vitals.  Patient has not take any medicines for symptoms today.  I Ordered, and personally interpreted labs.  Respiratory panel positive for COVID.  CBC with mild anemia with hemoglobin of 10.1.  No leukocytosis.  CMP with mild hyponatremia 132.  There is mild AKI with BUN/Cr elevated at 32/1.65.  We do not have prior metabolic panels to compare for this patient.  UA concerning for UTI with positive nitrite, trace leukocytes, many bacteria. I ordered imaging studies including chest x-ray. I independently visualized and interpreted imaging which showed no acute cardiopulmonary disease. I agree with the radiologist interpretation. Shared all results with patient.  Answered all questions. Provided patient with a dose of Tylenol  and soft IV fluid hydration.  Patient states that she is feeling a lot better and would like to go home now.  I also provided patient a dose with Rocephin  for her UTI.  I educated patient that I can admit her if she did not feel strong enough to go home.  Patient declined stating that she would really like to go home now.  Patient states that she is able to follow-up with her PCP in the next 2 weeks. Staffed with Dr. Leida Puna I have reviewed the patients home medicines and have  made adjustments as needed The patient has been appropriately medically screened and/or stabilized in the ED. I have low suspicion for any other emergent medical condition which would require further screening, evaluation or treatment in the ED or require inpatient management. At time of discharge the patient is hemodynamically stable and in no acute distress. I have discussed work-up results and diagnosis with patient and answered all questions. Patient is agreeable with discharge plan. We discussed strict return precautions for returning to the emergency department and they verbalized understanding.     Social Determinants of Health:  geriatric         Final Clinical Impression(s) / ED Diagnoses Final diagnoses:  COVID  Acute cystitis without hematuria    Rx / DC Orders ED Discharge Orders          Ordered    cephALEXin  (KEFLEX ) 500 MG capsule  2 times daily        02/17/24 1902              Don Fritter 02/17/24 1915    Almond Army, MD 02/18/24 [phone]

## 2024-02-17 NOTE — Telephone Encounter (Signed)
 Called daughter and they are at [facility] now.

## 2024-02-17 NOTE — ED Triage Notes (Signed)
 Pt c/o chest congestions, sore throat, soreness and weakness in bilateral legs with movement since yesterday; seen at [facility] yesterday but LWBS due to wait time; endorses fevers yesterday, denies urinary symptoms

## 2024-02-17 NOTE — ED Provider Triage Note (Signed)
 Emergency Medicine Provider Triage Evaluation Note  Melinda Moses , a 89 y.o. female  was evaluated in triage.  Pt complains of congestin, sore throat, body aches. Leg weakness/pain with standing. Fever 101 yesterday. Daughter recently died, had memorial service 2024/03/23, unsure who was sick that she was exposed to.   Review of Systems  Positive:  Negative:   Physical Exam  BP (!) 139/112 (BP Location: Right Arm)   Pulse (!) 56   Temp 98.6 F (37 C)   Resp 18   SpO2 99%  Gen:   Awake, no distress   Resp:  Normal effort  MSK:   Moves extremities without difficulty  Other:    Medical Decision Making  Medically screening exam initiated at 1:22 PM.  Appropriate orders placed.  Melinda Moses was informed that the remainder of the evaluation will be completed by another provider, this initial triage assessment does not replace that evaluation, and the importance of remaining in the ED until their evaluation is complete.     Darlis Eisenmenger, PA-C 02/17/24 1323

## 2024-02-17 NOTE — Discharge Instructions (Addendum)
 I am glad you are feeling better.  Please follow-up with your primary care provider for repeat labs in next couple weeks.  Seek emergency care experiencing any new or worsening symptoms.

## 2024-02-21 LAB — URINE CULTURE: Culture: >=100000 — AB

## 2024-02-22 ENCOUNTER — Telehealth (HOSPITAL_BASED_OUTPATIENT_CLINIC_OR_DEPARTMENT_OTHER): Payer: Self-pay

## 2024-02-22 NOTE — Telephone Encounter (Signed)
 Post ED Visit - Positive Culture Follow-up  Culture report reviewed by antimicrobial stewardship pharmacist: [facility] [x]  Adaline Ada, [place].D. []  Skeet Duke, Pharm.D., BCPS AQ-ID []  Leslee Rase, Pharm.D., BCPS []  Garland Junk, [address].D., BCPS []  [place], [place].D., BCPS, AAHIVP []  Alcide Aly, Pharm.D., BCPS, AAHIVP []  Jerri Morale, PharmD, BCPS []  Graham Laws, PharmD, BCPS []  Cleda Curly, PharmD, BCPS []  Tamar Fairly, PharmD []  Ballard Levels, PharmD, BCPS []  Ollen Beverage, PharmD  [facility] []  Arlyne Bering, PharmD []  Sherryle Don, PharmD []  [place], PharmD []  Delila Felty, Rph []  [place]) Cleora Daft, PharmD []  Augustina Block, PharmD []  Arie Kurtz, PharmD []  Sharlyn Deaner, PharmD []  Agnes Hose, PharmD []  Kendall Pauls, PharmD []  Gladstone Lamer, PharmD []  Armanda Bern, PharmD []  Tera Fellows, PharmD   Positive urine culture Treated with Cephalexin , organism sensitive to the same and no further patient follow-up is required at this time.  Delena Feil 02/22/2024, 1:49 PM
